# Patient Record
Sex: Male | Born: 1975 | Race: Black or African American | Hispanic: No | Marital: Single | Smoking: Current every day smoker
Health system: Southern US, Community
[De-identification: ages and names within clinical notes are randomized; demographics above are authoritative.]

---

## 2006-05-29 ENCOUNTER — Emergency Department: Payer: Self-pay | Admitting: Emergency Medicine

## 2007-07-18 ENCOUNTER — Emergency Department: Payer: Self-pay | Admitting: Emergency Medicine

## 2017-08-09 ENCOUNTER — Emergency Department
Admission: EM | Admit: 2017-08-09 | Discharge: 2017-08-09 | Disposition: A | Discharge status: Home / Self Care | Payer: Self-pay | Attending: Emergency Medicine | Admitting: Emergency Medicine

## 2017-08-09 DIAGNOSIS — J36 Peritonsillar abscess: Secondary | ICD-10-CM | POA: Insufficient documentation

## 2017-08-09 LAB — BASIC METABOLIC PANEL
ANION GAP: 6 (ref 5–15)
BUN: 13 mg/dL (ref 6–20)
CO2: 27 mmol/L (ref 22–32)
Calcium: 8.9 mg/dL (ref 8.9–10.3)
Chloride: 101 mmol/L (ref 101–111)
Creatinine, Ser: 1.1 mg/dL (ref 0.61–1.24)
GLUCOSE: 107 mg/dL — AB (ref 65–99)
POTASSIUM: 3.9 mmol/L (ref 3.5–5.1)
Sodium: 134 mmol/L — ABNORMAL LOW (ref 135–145)

## 2017-08-09 LAB — CBC WITH DIFFERENTIAL/PLATELET
BASOS ABS: 0.1 10*3/uL (ref 0–0.1)
Basophils Relative: 1 %
EOS PCT: 1 %
Eosinophils Absolute: 0.1 10*3/uL (ref 0–0.7)
HCT: 46.6 % (ref 40.0–52.0)
Hemoglobin: 15.4 g/dL (ref 13.0–18.0)
LYMPHS PCT: 7 %
Lymphs Abs: 1 10*3/uL (ref 1.0–3.6)
MCH: 27 pg (ref 26.0–34.0)
MCHC: 33.2 g/dL (ref 32.0–36.0)
MCV: 81.5 fL (ref 80.0–100.0)
Monocytes Absolute: 1.5 10*3/uL — ABNORMAL HIGH (ref 0.2–1.0)
Monocytes Relative: 11 %
NEUTROS ABS: 11.3 10*3/uL — AB (ref 1.4–6.5)
Neutrophils Relative %: 80 %
PLATELETS: 243 10*3/uL (ref 150–440)
RBC: 5.72 MIL/uL (ref 4.40–5.90)
RDW: 15.2 % — ABNORMAL HIGH (ref 11.5–14.5)
WBC: 14 10*3/uL — AB (ref 3.8–10.6)

## 2017-08-09 LAB — POCT RAPID STREP A: STREPTOCOCCUS, GROUP A SCREEN (DIRECT): NEGATIVE

## 2017-08-09 LAB — MONONUCLEOSIS SCREEN: MONO SCREEN: NEGATIVE

## 2017-08-09 MED ORDER — AMOXICILLIN-POT CLAVULANATE 875-125 MG PO TABS: 1.0000 | ORAL_TABLET | Freq: Two times a day (BID) | ORAL | 0 refills | Status: AC

## 2017-08-09 MED ORDER — DEXAMETHASONE SODIUM PHOSPHATE 10 MG/ML IJ SOLN
10.0000 mg | Freq: Once | INTRAMUSCULAR | Status: AC
  Administered 2017-08-09: 10 mg via INTRAVENOUS
  Filled 2017-08-09: qty 1

## 2017-08-09 MED ORDER — PREDNISONE 10 MG PO TABS: ORAL_TABLET | ORAL | 0 refills | Status: DC

## 2017-08-09 MED ORDER — SODIUM CHLORIDE 0.9 % IV SOLN
3.0000 g | Freq: Once | INTRAVENOUS | Status: AC
  Administered 2017-08-09: 3 g via INTRAVENOUS
  Filled 2017-08-09: qty 3

## 2017-08-09 NOTE — Discharge Instructions (Signed)
Today it appears that you have a peritonsillar cellulitis which could become a peritonsillar abscess. You were given the first dose of antibiotics in the emergency department. Begin taking Augmentin. Today the lowest Price is at [employer]. A coupon was printed for you. Also begin taking prednisone as directed. You may also take Tylenol with this medication as needed for fever or throat pain. Return to the emergency room if any severe worsening of your symptoms or inability to swallow pills. You may need to break the Augmentin 1/2 to swallow if too large.   Follow-up with Dr. Jenne CampusMcQueen if any continued problems on Monday and also for a recheck. His office location and phone number are listed on her discharge papers.

## 2017-08-09 NOTE — ED Triage Notes (Signed)
Pt c/o sore throat that began two days ago. Pt has dry cough and difficultly swallowing.

## 2017-08-09 NOTE — ED Provider Notes (Signed)
[facility] Emergency Department Provider Note   ____________________________________________   First MD Initiated Contact with Patient 08/09/17 1105     (approximate)  I have reviewed the triage vital signs and the nursing notes.   HISTORY  Chief Complaint Sore Throat   HPI Javier Wood is a 41 y.o. male is here complaining of sore throatthat began approximately 2 days ago. Patient states she also has had a dry cough. He has felt warm but not actually had a temperature that he is aware of. Patient states his throat is more painful when he swallows but has been swallowing fluids and also over-the-counter medication without difficulty. He states yesterday he slept a lot. He is not aware of any exposure to strep throat. He rates his pain as an 8/10.  History reviewed. No pertinent past medical history.  There are no active problems to display for this patient.   History reviewed. No pertinent surgical history.  Prior to Admission medications   Medication Sig Start Date End Date Taking? Authorizing Provider  amoxicillin-clavulanate (AUGMENTIN) 875-125 MG tablet Take 1 tablet by mouth 2 (two) times daily. 08/09/17 08/16/17  Tommi Rumps, PA-C  predniSONE (DELTASONE) 10 MG tablet Take 6 tablets  today, on day 2 take 5 tablets, day 3 take 4 tablets, day 4 take 3 tablets, day 5 take  2 tablets and 1 tablet the last day 08/09/17   Tommi Rumps, PA-C    Allergies Patient has no known allergies.  No family history on file.  Social History Social History  Substance Use Topics  . Smoking status: Never Smoker  . Smokeless tobacco: Never Used  . Alcohol use 1.2 oz/week    2 Cans of beer per week    Review of Systems Constitutional: No fever/chills Eyes: No visual changes. ENT: Positive sore throat. Cardiovascular: Denies chest pain. Respiratory: Denies shortness of breath. Gastrointestinal: No nausea, no vomiting.  Musculoskeletal: Negative  for back pain. Skin: Negative for rash. Neurological: Negative for headaches ____________________________________________   PHYSICAL EXAM:  VITAL SIGNS: ED Triage Vitals [08/09/17 1059]  Enc Vitals Group     BP (!) 127/93     Pulse Rate (!) 119     Resp      Temp 98.3 F (36.8 C)     Temp Source Oral     SpO2 98 %     Weight      Height      Head Circumference      Peak Flow      Pain Score 8     Pain Loc      Pain Edu?      Excl. in GC?    Constitutional: Alert and oriented. Well appearing and in no acute distress. Eyes: Conjunctivae are normal. PERRL. EOMI. Head: Atraumatic. Nose: No congestion/rhinnorhea. Mouth/Throat: Mucous membranes are moist. Mild erythema with right tonsil larger than left but no exudate is present. Uvula is still midline. No edema. Patient is having no difficulty with talking or swallowing. Patient speaks with a muffled voice. Neck: No stridor.   Hematological/Lymphatic/Immunilogical: No cervical lymphadenopathy. Cardiovascular: Normal rate, regular rhythm. Grossly normal heart sounds.  Good peripheral circulation. Respiratory: Normal respiratory effort.  No retractions. Lungs CTAB. Musculoskeletal: Moves upper and lower extremities without difficulty. Normal gait was noted. Neurologic:  Normal speech and language. No gross focal neurologic deficits are appreciated.  Skin:  Skin is warm, dry and intact. No rash noted. Psychiatric: Mood and affect are normal. Speech  and behavior are normal.  ____________________________________________   LABS (all labs ordered are listed, but only abnormal results are displayed)  Labs Reviewed  CBC WITH DIFFERENTIAL/PLATELET - Abnormal; Notable for the following:       Result Value   WBC 14.0 (*)    RDW 15.2 (*)    Neutro Abs 11.3 (*)    Monocytes Absolute 1.5 (*)    All other components within normal limits  BASIC METABOLIC PANEL - Abnormal; Notable for the following:    Sodium 134 (*)    Glucose, Bld  107 (*)    All other components within normal limits  MONONUCLEOSIS SCREEN  POCT RAPID STREP A    PROCEDURES  Procedure(s) performed: None  Procedures  Critical Care performed: No  ____________________________________________   INITIAL IMPRESSION / ASSESSMENT AND PLAN / ED COURSE  Pertinent labs & imaging results that were available during my care of the patient were reviewed by me and considered in my medical decision making (see chart for details).  Patient was given Unasyn 3 g IV and Decadron 10 mg. He voiced that his throat was feeling better. He still continues to drink fluids with no difficulty swallowing or maintaining secretions. Patient was discharged on Augmentin 875 twice a day for 10 days and a tapering dose of prednisone starting at 60 mg and tapering over the next 6 days. He is follow-up with Dr. Jenne CampusMcQueen since he does not have a PCP. He is instructed to call Monday for an appointment. He will return to the emergency room if any severe worsening of his symptoms.   __________________________________________   FINAL CLINICAL IMPRESSION(S) / ED DIAGNOSES  Final diagnoses:  Peritonsillar cellulitis      NEW MEDICATIONS STARTED DURING THIS VISIT:  Discharge Medication List as of 08/09/2017  1:26 PM    START taking these medications   Details  amoxicillin-clavulanate (AUGMENTIN) 875-125 MG tablet Take 1 tablet by mouth 2 (two) times daily., Starting Sat 08/09/2017, Until Sat 08/16/2017, Print    predniSONE (DELTASONE) 10 MG tablet Take 6 tablets  today, on day 2 take 5 tablets, day 3 take 4 tablets, day 4 take 3 tablets, day 5 take  2 tablets and 1 tablet the last day, Print         Note:  This document was prepared using Dragon voice recognition software and may include unintentional dictation errors.    Tommi RumpsSummers, Shavell Nored L, PA-C 08/09/17 1511    Loleta RoseForbach, Cory, MD 08/09/17 [phone]

## 2018-10-02 LAB — HM HIV SCREENING LAB: HM HIV Screening: NEGATIVE

## 2019-01-31 ENCOUNTER — Other Ambulatory Visit
Admission: AD | Admit: 2019-01-31 | Discharge: 2019-01-31 | Disposition: A | Discharge status: Home / Self Care | Attending: Family Medicine | Admitting: Family Medicine

## 2019-01-31 ENCOUNTER — Other Ambulatory Visit: Payer: Self-pay

## 2019-01-31 NOTE — ED Notes (Signed)
Patient here with [facility] for forensic blood draw under a search warrant.

## 2019-01-31 NOTE — ED Notes (Signed)
Blood specimens obtained from left antecub after area cleansed with betadine.  Patient tolerated well.  Specimens x 2 given to [employer].

## 2020-03-07 ENCOUNTER — Other Ambulatory Visit: Payer: Self-pay

## 2020-03-07 ENCOUNTER — Ambulatory Visit: Payer: Self-pay | Admitting: Physician Assistant

## 2020-03-07 DIAGNOSIS — Z113 Encounter for screening for infections with a predominantly sexual mode of transmission: Secondary | ICD-10-CM

## 2020-03-07 DIAGNOSIS — Z202 Contact with and (suspected) exposure to infections with a predominantly sexual mode of transmission: Secondary | ICD-10-CM

## 2020-03-07 MED ORDER — CEFTRIAXONE SODIUM 250 MG IJ SOLR
500.0000 mg | Freq: Once | INTRAMUSCULAR | Status: AC
  Administered 2020-03-07: 17:00:00 500 mg via INTRAMUSCULAR

## 2020-03-07 MED ORDER — DOXYCYCLINE HYCLATE 100 MG PO TABS: 100.0000 mg | ORAL_TABLET | Freq: Two times a day (BID) | ORAL | 0 refills | Status: AC

## 2020-03-08 ENCOUNTER — Encounter: Payer: Self-pay | Admitting: Physician Assistant

## 2020-03-08 NOTE — Progress Notes (Signed)
   [facility] Department STI clinic/screening visit  Subjective:  Javier Wood is a 44 y.o. male being seen today for an STI screening visit. The patient reports they do not have symptoms.    Patient has the following medical conditions:  There are no problems to display for this patient.    Chief Complaint  Patient presents with  . SEXUALLY TRANSMITTED DISEASE    HPI  Patient reports that he does not have any symptoms but that he is a contact to [facility].  States that he was notified by a recent partner that she tested positive for GC.  Declines screening exam, testing and blood work today and requests treatment only.   See flowsheet for further details and programmatic requirements.    The following portions of the patient's history were reviewed and updated as appropriate: allergies, current medications, past medical history, past social history, past surgical history and problem list.  Objective:  There were no vitals filed for this visit.  Physical Exam Constitutional:      General: He is not in acute distress.    Appearance: Normal appearance. He is normal weight.  HENT:     Head: Normocephalic and atraumatic.  Eyes:     Conjunctiva/sclera: Conjunctivae normal.  Pulmonary:     Effort: Pulmonary effort is normal.  Neurological:     Mental Status: He is alert and oriented to person, place, and time.  Psychiatric:        Mood and Affect: Mood normal.        Behavior: Behavior normal.        Thought Content: Thought content normal.        Judgment: Judgment normal.       Assessment and Plan:  Javier Wood is a 44 y.o. male presenting to the [facility] Department for STI screening  1. Screening for STD (sexually transmitted disease) Patient into clinic without symptoms. Rec condoms with all sex. RTC prn.  2. Gonorrhea contact Will treat as a contact to [facility] and for possible Chlamydia with Ceftriaxone 500mg  IM and Doxycycline 100mg  #14 1  po BID for 7 days. No sex for 14 days and until after partner completes treatment. Call with question or concerns . - cefTRIAXone (ROCEPHIN) injection 500 mg - doxycycline (VIBRA-TABS) 100 MG tablet; Take 1 tablet (100 mg total) by mouth 2 (two) times daily for 7 days.  Dispense: 14 tablet; Refill: 0     No follow-ups on file.  No future appointments.  , PA

## 2020-03-12 NOTE — Progress Notes (Signed)
Chart reviewed by Pharmacist  Suzanne Walker PharmD, Contract Pharmacist at [facility]  

## 2020-05-25 ENCOUNTER — Other Ambulatory Visit: Payer: Self-pay

## 2020-05-25 ENCOUNTER — Encounter: Payer: Self-pay | Admitting: Physician Assistant

## 2020-05-25 ENCOUNTER — Ambulatory Visit: Payer: Self-pay | Admitting: Physician Assistant

## 2020-05-25 DIAGNOSIS — Z113 Encounter for screening for infections with a predominantly sexual mode of transmission: Secondary | ICD-10-CM

## 2020-05-25 DIAGNOSIS — Z202 Contact with and (suspected) exposure to infections with a predominantly sexual mode of transmission: Secondary | ICD-10-CM

## 2020-05-25 MED ORDER — DOXYCYCLINE HYCLATE 100 MG PO TABS: 100.0000 mg | ORAL_TABLET | Freq: Two times a day (BID) | ORAL | 0 refills | Status: AC

## 2020-05-25 MED ORDER — CEFTRIAXONE SODIUM 500 MG IJ SOLR
500.0000 mg | Freq: Once | INTRAMUSCULAR | Status: AC
  Administered 2020-05-25: 500 mg via INTRAMUSCULAR

## 2020-05-25 NOTE — Progress Notes (Signed)
Pt here for STD screening. 

## 2020-05-25 NOTE — Progress Notes (Signed)
Pt treated as a contact to Gonorrhea per Sadie Haber, PA order. Pt tolerated well. Counseled pt per provider orders and pt states understanding. Provider orders completed.

## 2020-05-26 ENCOUNTER — Encounter: Payer: Self-pay | Admitting: Physician Assistant

## 2020-05-26 NOTE — Progress Notes (Signed)
   [facility] Department STI clinic/screening visit  Subjective:  Javier Wood is a 44 y.o. male being seen today for an STI screening visit. The patient reports they do not have symptoms.    Patient has the following medical conditions:  There are no problems to display for this patient.    Chief Complaint  Patient presents with  . SEXUALLY TRANSMITTED DISEASE    screening    HPI  Patient reports that he is not having symptoms but is a contact to GC.  Denies chronic conditions, surgeries and regular medicines.  Requests treatment only today.    See flowsheet for further details and programmatic requirements.    The following portions of the patient's history were reviewed and updated as appropriate: allergies, current medications, past medical history, past social history, past surgical history and problem list.  Objective:  There were no vitals filed for this visit.  Physical Exam Constitutional:      General: He is not in acute distress.    Appearance: Normal appearance.  HENT:     Head: Normocephalic and atraumatic.  Eyes:     Conjunctiva/sclera: Conjunctivae normal.  Pulmonary:     Effort: Pulmonary effort is normal.  Neurological:     Mental Status: He is alert and oriented to person, place, and time.  Psychiatric:        Mood and Affect: Mood normal.        Behavior: Behavior normal.        Thought Content: Thought content normal.        Judgment: Judgment normal.       Assessment and Plan:  [name] is a 44 y.o. male presenting to the [facility] Department for STI screening  1. Screening for STD (sexually transmitted disease) Patient into clinic without symptoms.   Declines screening exam and blood work today. Rec condoms with all sex.   2. Gonorrhea contact Treat as a contact to [facility] and to cover for Chlamydia with Ceftriaxone 500mg  IM today and Doxycycline 100mg  #14 1 po BID for 7days. No sex until 7 days after  completing medicine and until after partner completes treatment. Call with questions or concerns. - cefTRIAXone (ROCEPHIN) injection 500 mg - doxycycline (VIBRA-TABS) 100 MG tablet; Take 1 tablet (100 mg total) by mouth 2 (two) times daily for 7 days.  Dispense: 14 tablet; Refill: 0     Return for PRN.  Future Appointments  Date Time Provider Department Center  05/30/2020  3:30 PM , MD [facility] None    Corky Downs, [facility]

## 2020-05-30 ENCOUNTER — Other Ambulatory Visit: Payer: Self-pay

## 2020-05-30 ENCOUNTER — Ambulatory Visit: Admitting: Internal Medicine

## 2020-06-28 ENCOUNTER — Other Ambulatory Visit: Payer: Self-pay

## 2020-06-28 ENCOUNTER — Ambulatory Visit: Payer: Self-pay | Admitting: Family Medicine

## 2020-06-28 DIAGNOSIS — Z202 Contact with and (suspected) exposure to infections with a predominantly sexual mode of transmission: Secondary | ICD-10-CM

## 2020-07-04 ENCOUNTER — Encounter: Payer: Self-pay | Admitting: Family Medicine

## 2020-07-04 MED ORDER — CEFTRIAXONE SODIUM 500 MG IJ SOLR
500.0000 mg | Freq: Once | INTRAMUSCULAR | Status: AC
  Administered 2020-06-28: 500 mg via INTRAMUSCULAR

## 2020-07-04 NOTE — Progress Notes (Signed)
   [facility] Department STI clinic/screening visit  Subjective:  Javier Wood is a 44 y.o. male being seen today for an STI screening visit. The patient reports they do not have symptoms.    Patient has the following medical conditions:  There are no problems to display for this patient.    Chief Complaint  Patient presents with  . SEXUALLY TRANSMITTED DISEASE    screening/ contact to gonorrhea     HPI  Patient reports a contact to Gonorrhea, having intercourse with a untreated partner.  Patient declined exam and testing .  Patient on wants to be treated.  Patient was seen in 05/26/2020.     See flowsheet for further details and programmatic requirements.    The following portions of the patient's history were reviewed and updated as appropriate: allergies, current medications, past medical history, past social history, past surgical history and problem list.  Objective:  There were no vitals filed for this visit.  Physical Exam Neurological:     Mental Status: He is alert.  Psychiatric:        Mood and Affect: Mood normal.        Behavior: Behavior normal.     Patient declined exam.    Assessment and Plan:  Javier Wood is a 44 y.o. male presenting to the [facility] Department for STI screening    1. Gonorrhea contact - cefTRIAXone (ROCEPHIN) injection 500 mg  Patient declined exam reports as contact to untreated partner.  Patient was treated per standing order as contact with 500 mg of Rocephin IM x 1 in R Deltoid.  Patient instructed no sex for next 7 days and no sex with untreated partner. Condoms declined.   Patient verbalizes understanding.     Return if symptoms worsen or fail to improve.  No future appointments.  Wendi Snipes [facility]

## 2020-07-04 NOTE — Progress Notes (Signed)
Attestation of Attending Supervision of Enhanced Role Nurse:  At public health departments, Enhanced Role Nurses work under standing orders and procedures to provide STI related screening services.. I agree with the care provided to this patient and was available for any consultation as needed.  I have reviewed the RN's note and chart.  I was not consulted by the RN for the patient. If consulted documentation of consult is reflected in the RN's documentation.   Arian Mcquitty Niles Abdou Stocks, MD, MPH, ABFM Medical Director  [facility].   

## 2020-08-13 ENCOUNTER — Other Ambulatory Visit: Payer: Self-pay

## 2020-08-13 ENCOUNTER — Emergency Department
Admission: EM | Admit: 2020-08-13 | Discharge: 2020-08-13 | Disposition: A | Discharge status: Home / Self Care | Attending: Emergency Medicine | Admitting: Emergency Medicine

## 2020-08-13 ENCOUNTER — Emergency Department

## 2020-08-13 DIAGNOSIS — S060X1A Concussion with loss of consciousness of 30 minutes or less, initial encounter: Secondary | ICD-10-CM | POA: Insufficient documentation

## 2020-08-13 DIAGNOSIS — W228XXA Striking against or struck by other objects, initial encounter: Secondary | ICD-10-CM | POA: Insufficient documentation

## 2020-08-13 DIAGNOSIS — Y999 Unspecified external cause status: Secondary | ICD-10-CM | POA: Insufficient documentation

## 2020-08-13 DIAGNOSIS — Y9289 Other specified places as the place of occurrence of the external cause: Secondary | ICD-10-CM | POA: Insufficient documentation

## 2020-08-13 DIAGNOSIS — S7001XA Contusion of right hip, initial encounter: Secondary | ICD-10-CM | POA: Insufficient documentation

## 2020-08-13 DIAGNOSIS — F17210 Nicotine dependence, cigarettes, uncomplicated: Secondary | ICD-10-CM | POA: Insufficient documentation

## 2020-08-13 DIAGNOSIS — Y9389 Activity, other specified: Secondary | ICD-10-CM | POA: Insufficient documentation

## 2020-08-13 DIAGNOSIS — F17290 Nicotine dependence, other tobacco product, uncomplicated: Secondary | ICD-10-CM | POA: Insufficient documentation

## 2020-08-13 MED ORDER — MELOXICAM 15 MG PO TABS: 15.0000 mg | ORAL_TABLET | Freq: Every day | ORAL | 0 refills | Status: AC

## 2020-08-13 MED ORDER — METHOCARBAMOL 500 MG PO TABS: 500.0000 mg | ORAL_TABLET | Freq: Four times a day (QID) | ORAL | 0 refills | Status: AC

## 2020-08-13 MED ORDER — OXYCODONE-ACETAMINOPHEN 5-325 MG PO TABS
1.0000 | ORAL_TABLET | Freq: Once | ORAL | Status: AC
Start: 2020-08-13 — End: 2020-08-13
  Administered 2020-08-13: 1 via ORAL
  Filled 2020-08-13: qty 1

## 2020-08-13 MED ORDER — OXYCODONE-ACETAMINOPHEN 5-325 MG PO TABS: 1.0000 | ORAL_TABLET | ORAL | 0 refills | Status: AC | PRN

## 2020-08-13 NOTE — ED Notes (Signed)
Patient to go to [facility] per MD Cyril Loosen

## 2020-08-13 NOTE — ED Provider Notes (Signed)
[facility] Emergency Department Provider Note  ____________________________________________   First MD Initiated Contact with Patient 08/13/20 2032     (approximate)  I have reviewed the triage vital signs and the nursing notes.   HISTORY  Chief Complaint Head Injury  HPI Javier Wood is a 44 y.o. male who presents to the emergency department for evaluation following a Worker's Comp. injury that occurred on Friday.  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  It hit a large box before knocking into him on the left side of his body, causing the right side of his body to hit a rock wall.  At the time of injury, he states he lost consciousness and coworkers described this for being about 30 to 60 seconds.  He was seen at an urgent care outside of our system where his right ear laceration was repaired and he was placed on Bactrim and ibuprofen.  However he presents today due to worsening headache, pain with opening and closing his jaw on the right side, right hip pain.  He states that these have worsened over the last couple days as he has become more sore from the incident.  He denies any further syncope, dizziness, changes in vision.  The headache is isolated to the right side of his head.  He has tried the ibuprofen that he was prescribed without any improvement.         History reviewed. No pertinent past medical history.  There are no problems to display for this patient.   History reviewed. No pertinent surgical history.  Prior to Admission medications   Medication Sig Start Date End Date Taking? Authorizing Provider  meloxicam (MOBIC) 15 MG tablet Take 1 tablet (15 mg total) by mouth daily for 15 days. 08/13/20 08/28/20  Lucy Chris, PA  methocarbamol (ROBAXIN) 500 MG tablet Take 1 tablet (500 mg total) by mouth 4 (four) times  daily for 5 days. 08/13/20 08/18/20  Lucy Chris, PA  oxyCODONE-acetaminophen (PERCOCET) 5-325 MG tablet Take 1 tablet by mouth every 4 (four) hours as needed for severe pain. 08/13/20 08/13/21  Lucy Chris, PA    Allergies Patient has no known allergies.  No family history on file.  Social History Social History   Tobacco Use   Smoking status: Current Every Day Smoker    Types: E-cigarettes, Cigarettes   Smokeless tobacco: Never Used  Substance Use Topics   Alcohol use: Yes    Comment: 3 times a week    Drug use: Not Currently    Types: Marijuana    Review of Systems Constitutional: No fever/chills Eyes: No visual changes. ENT: + Jaw pain Cardiovascular: Denies chest pain. Respiratory: Denies shortness of breath. Gastrointestinal: No abdominal pain.  No nausea, no vomiting.  No diarrhea.  No constipation. Genitourinary: Negative for dysuria. Musculoskeletal: + Right hip pain Skin: Negative for rash. Neurological: + Headache, denies focal weakness or numbness. ____________________________________________   PHYSICAL EXAM:  VITAL SIGNS: ED Triage Vitals  Enc Vitals Group     BP 08/13/20 1855 126/84     Pulse Rate 08/13/20 1855 86     Resp 08/13/20 1855 16     Temp 08/13/20 1855 98.3 F (36.8 C)     Temp Source 08/13/20 1855 Oral     SpO2 08/13/20 1855 98 %     Weight 08/13/20  1915 215 lb (97.5 kg)     Height 08/13/20 1915 6\' 2"  (1.88 m)     Head Circumference --      Peak Flow --      Pain Score 08/13/20 1916 10     Pain Loc --      Pain Edu? --      Excl. in GC? --    Constitutional: Alert and oriented. Well appearing and in no acute distress. Eyes: Conjunctivae are normal. PERRL. EOMI. Head: There is mild soft tissue swelling noted to the right aspect of the scalp just superior to the right ear.  There is a set of sutures in place at the auricle of the right ear that appears in appropriate alignment without drainage. Nose: No  congestion/rhinnorhea. Mouth/Throat: Mucous membranes are moist.  Oropharynx non-erythematous.  There is tenderness to the right mandible and maxilla. Neck: No stridor.  No cervical spine tenderness to palpation Cardiovascular: Normal rate, regular rhythm. Grossly normal heart sounds.  Good peripheral circulation. Respiratory: Normal respiratory effort.  No retractions. Lungs CTAB. Gastrointestinal: Soft and nontender. No distention. No abdominal bruits. No CVA tenderness. Musculoskeletal: There is a abrasion to the right lower leg.  There is a small abrasion with a larger underlying hematoma to the lateral aspect of the right hip. Neurologic:  Normal speech and language. No gross focal neurologic deficits are appreciated. No gait instability. Skin:  Skin is warm, dry and intact. No rash noted. Psychiatric: Mood and affect are normal. Speech and behavior are normal.   ____________________________________________  RADIOLOGY  Official radiology report(s): CT Head Wo Contrast  Result Date: 08/13/2020 CLINICAL DATA:  Struck in head with bucket EXAM: CT HEAD WITHOUT CONTRAST TECHNIQUE: Contiguous axial images were obtained from the base of the skull through the vertex without intravenous contrast. COMPARISON:  None. FINDINGS: Brain: No acute territorial infarction, hemorrhage or intracranial mass. The ventricles are nonenlarged. Vascular: No hyperdense vessel or unexpected calcification. Skull: Normal. Negative for fracture or focal lesion. Sinuses/Orbits: No acute finding. Other: None IMPRESSION: Negative non contrasted CT appearance of the brain. Electronically Signed   By: Jasmine PangKim  Fujinaga M.D.   On: 08/13/2020 19:41   DG Hip Unilat W or Wo Pelvis 2-3 Views Right  Result Date: 08/13/2020 CLINICAL DATA:  Initial evaluation for acute right hip pain status post recent trauma. EXAM: DG HIP (WITH OR WITHOUT PELVIS) 2-3V RIGHT COMPARISON:  None. FINDINGS: No acute fracture dislocation. Femoral head in  normal alignment within the acetabula. 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 No focal osseous lesions. No visible soft tissue injury. Few retained metallic BBs overlie the soft tissues of the proximal right thigh. IMPRESSION: 1. No acute osseous abnormality about the right hip. 2. Mild focal prominence at the anterosuperior aspect of the femoral head/neck junction, suggesting underlying cam type femoroacetabular impingement. Electronically Signed   By: Rise MuBenjamin  McClintock M.D.   On: 08/13/2020 19:55   CT Maxillofacial Wo Contrast  Result Date: 08/13/2020 CLINICAL DATA:  Status post trauma. EXAM: CT MAXILLOFACIAL WITHOUT CONTRAST TECHNIQUE: Multidetector CT imaging of the maxillofacial structures was performed. Multiplanar CT image reconstructions were also generated. COMPARISON:  None. FINDINGS: Osseous: No fracture or mandibular dislocation. No destructive process. Orbits: Negative. No traumatic or inflammatory finding. Sinuses: Clear. Soft tissues: Mild-to-moderate severity focal soft tissue swelling is seen just above the right ear. Two subcentimeter foci of soft tissue air are also noted within this  region. Limited intracranial: No significant or unexpected finding. IMPRESSION: 1. Mild-to-moderate severity focal soft tissue swelling just above the right ear. 2. No evidence of acute fracture or dislocation. Electronically Signed   By: Aram Candela M.D.   On: 08/13/2020 21:07    ____________________________________________   INITIAL IMPRESSION / ASSESSMENT AND PLAN / ED COURSE  As part of my medical decision making, I reviewed the following data within the electronic MEDICAL RECORD NUMBER Nursing notes reviewed and incorporated, Notes from prior ED visits and [place] Controlled Substance Database        Javier Wood is a 44 year old male who presents to the emergency department for  evaluation following a workers comp injury that occurred 2 days ago when he was hit with an excavator knocking him into a rock wall on the right side of his body.  He was concern for increasing headache as well as pain in his right jaw and right hip.  His CT of his head as well as maxillofacial CT are negative for any acute findings.  His right hip x-rays are also negative for any acute fracture.  His physical exam is reassuring with eyes PERRLA and EO MI.  Neurological exam is grossly normal.  The patient also has full range of motion of all of his joints despite the pain that he is having.  At this time, we will treat the patient for concussion as well as contusion to the right hip.  He was prescribed ibuprofen at the urgent care but will change his anti-inflammatory and also give muscle relaxer and pain medicine.  He was also given a work note to remain out of work for the week.  He should return to follow-up with his PCP and it was encouraged that he should not drive on the pain medicine and muscle relaxer.  The patient is in acknowledgment with this and will return should he experience any worsening symptoms.        ____________________________________________   FINAL CLINICAL IMPRESSION(S) / ED DIAGNOSES  Final diagnoses:  Concussion with loss of consciousness of 30 minutes or less, initial encounter  Contusion of right hip, initial encounter     ED Discharge Orders         Ordered    oxyCODONE-acetaminophen (PERCOCET) 5-325 MG tablet  Every 4 hours PRN        08/13/20 2135    meloxicam (MOBIC) 15 MG tablet  Daily        08/13/20 2135    methocarbamol (ROBAXIN) 500 MG tablet  4 times daily        08/13/20 2135          *Please note:  Javier Wood was evaluated in Emergency Department on 08/13/2020 for the symptoms described in the history of present illness. He was evaluated in the context of the global COVID-19 pandemic, which necessitated consideration that the patient might  be at risk for infection with the SARS-CoV-2 virus that causes COVID-19. Institutional protocols and algorithms that pertain to the evaluation of patients at risk for COVID-19 are in a state of rapid change based on information released by regulatory bodies including the CDC and federal and state organizations. These policies and algorithms were followed during the patient's care in the ED.  Some ED evaluations and interventions may be delayed as a result of limited staffing during and the pandemic.*   Note:  This document was prepared using Dragon voice recognition software and may include unintentional dictation errors.  Lucy Chris, PA 08/13/20 2252    Sharman Cheek, MD 08/13/20 [phone]

## 2020-08-13 NOTE — ED Triage Notes (Signed)
Patient reports being struck in the head by the bucket of an excavator on 9/10. Patient seen at urgent care at the time - sutures in place on right ear. Patient c/o continued headache and right hip pain.   Patient reports LOC after accident occurred.

## 2020-10-11 ENCOUNTER — Ambulatory Visit: Payer: Self-pay | Admitting: *Deleted

## 2021-04-14 IMAGING — CT CT MAXILLOFACIAL W/O CM
3 series · 16 of 47 positions shown, 19 images · non-contrast
Comparison: None.

CLINICAL DATA: Status post trauma.

EXAM:
CT MAXILLOFACIAL WITHOUT CONTRAST
TECHNIQUE: Multidetector CT imaging of the maxillofacial structures was
performed. Multiplanar CT image reconstructions were also generated.

[Series 2: max soft · axial · 0.37mm/px · z∈[-180,-18]mm · 10 of 95 slices shown, 13 images]
[im 7/95  brain]
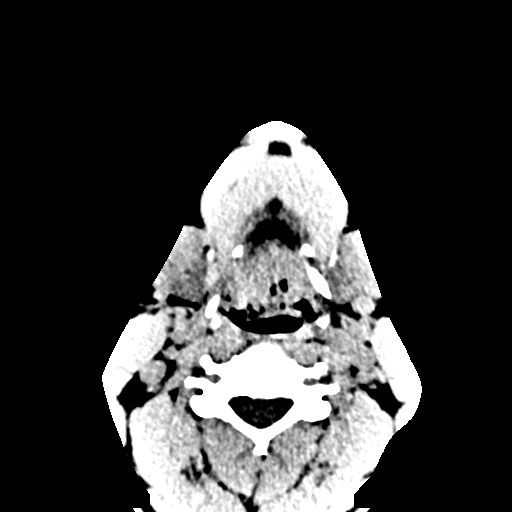
[im 7/95  bone]
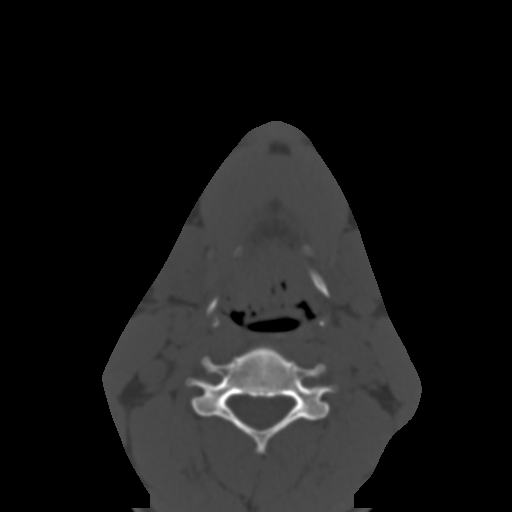
[im 17/95  bone]
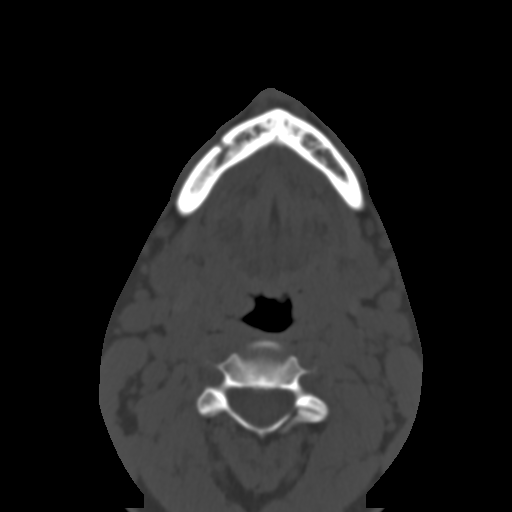
[im 26/95  bone]
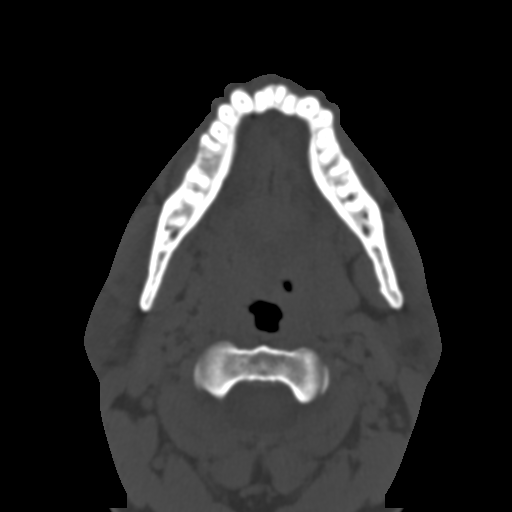
[im 33/95  bone]
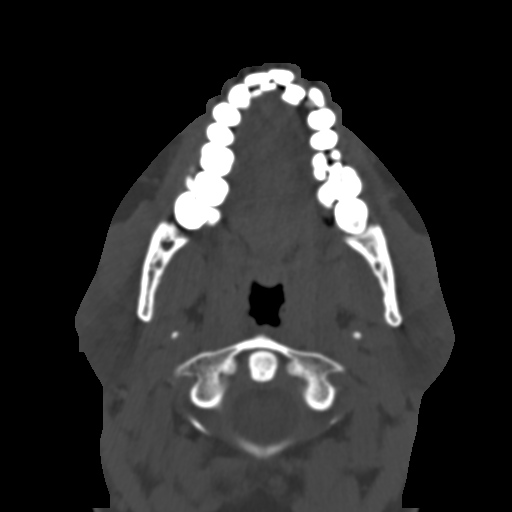
[im 43/95  brain]
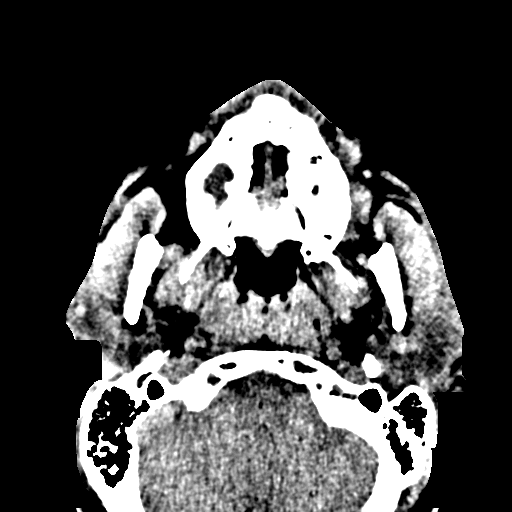
[im 43/95  bone]
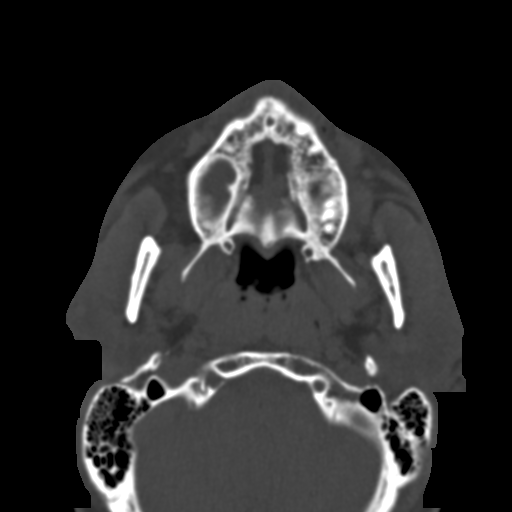
[im 52/95  bone]
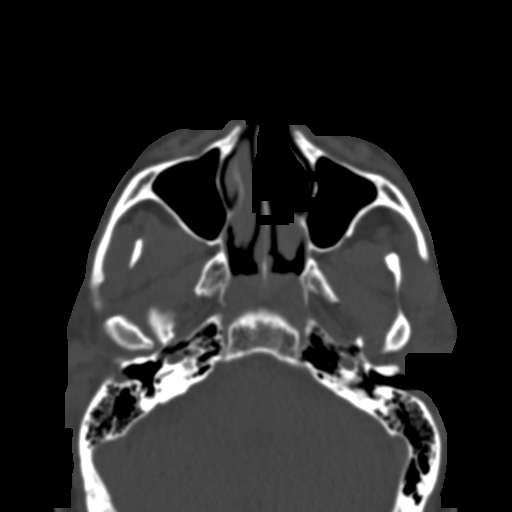
[im 62/95  bone]
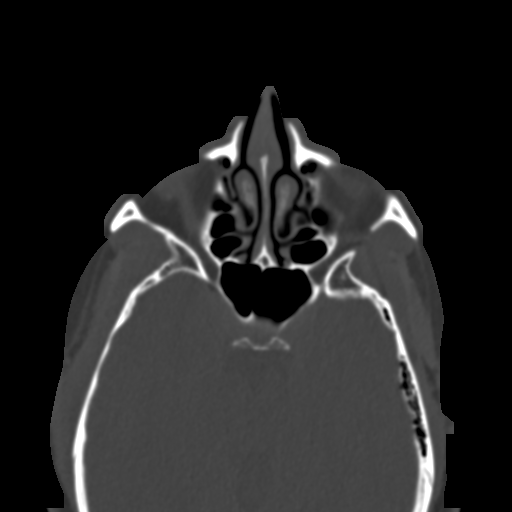
[im 72/95  bone]
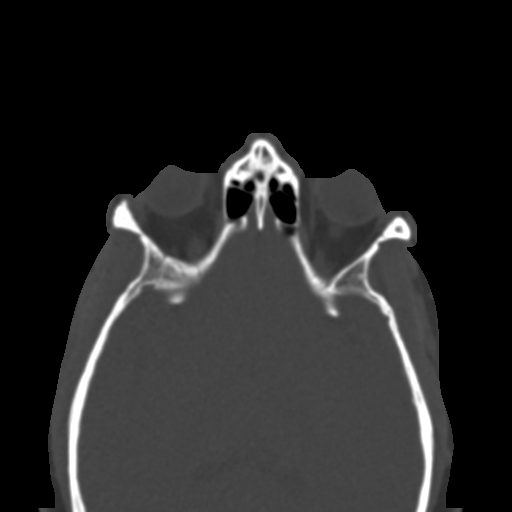
[im 78/95  brain]
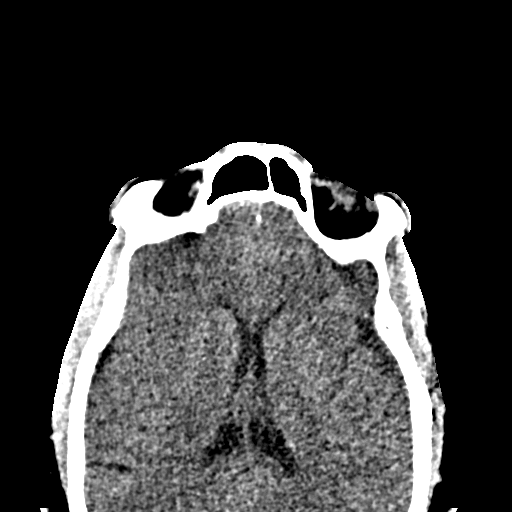
[im 78/95  bone]
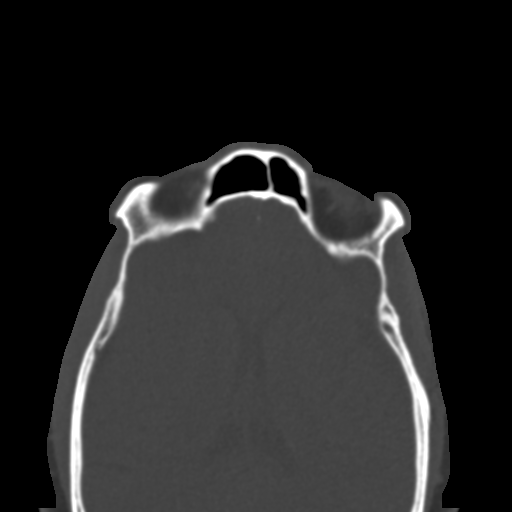
[im 88/95  bone]
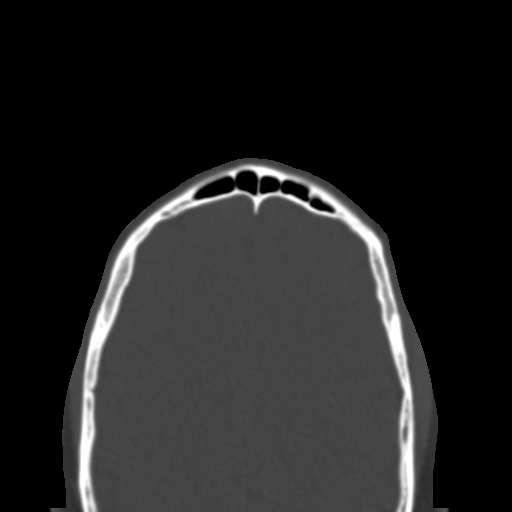

[Series 6: coronal soft · coronal · 0.35mm/px · 3 of 88 slices shown]
[im 30/88  bone]
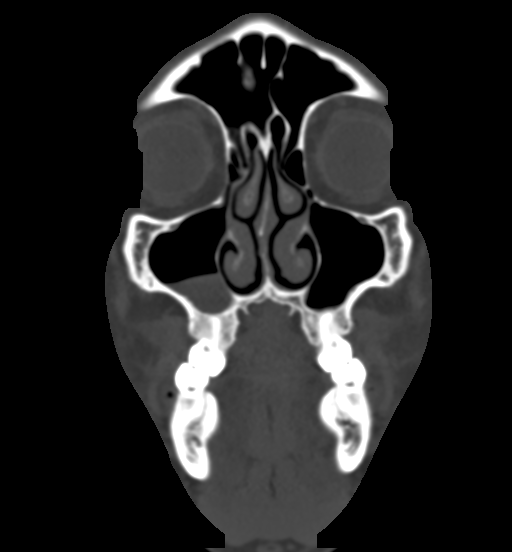
[im 39/88  bone]
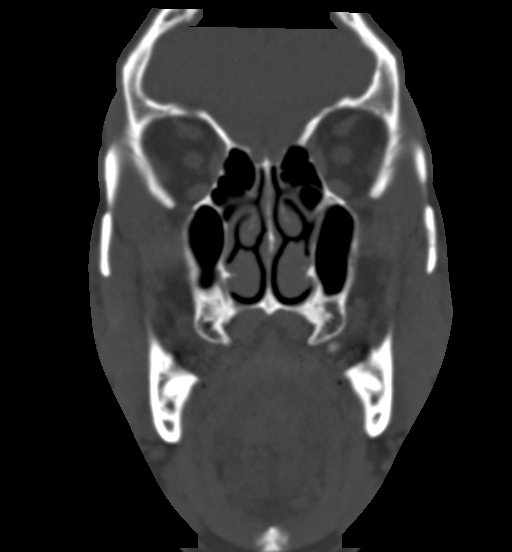
[im 49/88  bone]
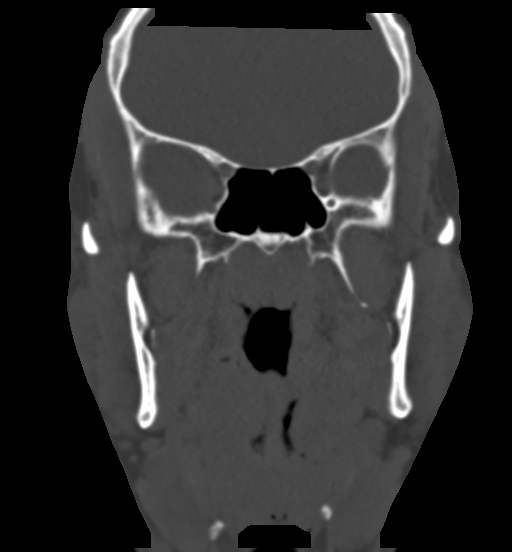

[Series 7: sagittal soft · sagittal · 0.38mm/px · 3 of 92 slices shown]
[im 31/92  bone]
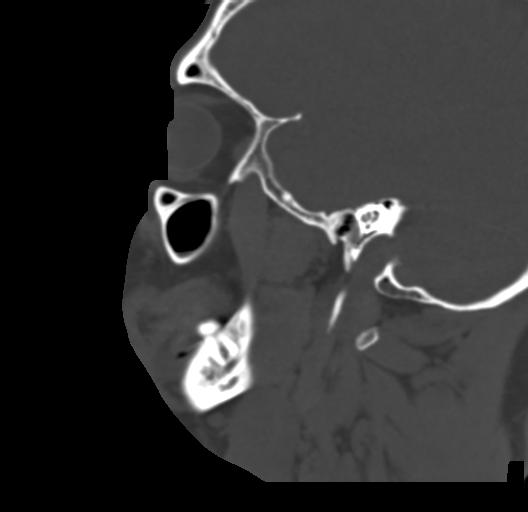
[im 46/92  bone]
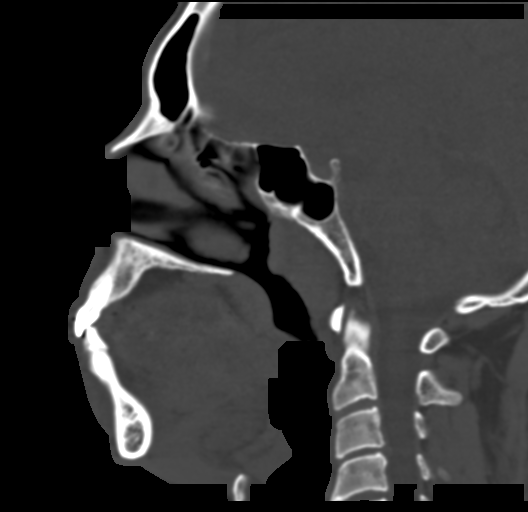
[im 61/92  bone]
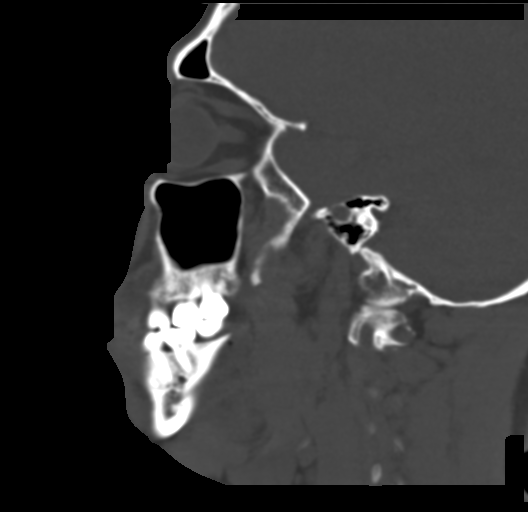

[16 of 47 positions shown; findings below may reference images not displayed]

FINDINGS: Osseous: No fracture or mandibular dislocation. No destructive
process.

Orbits: Negative. No traumatic or inflammatory finding.

Sinuses: Clear.

Soft tissues: Mild-to-moderate severity focal soft tissue swelling
is seen just above the right ear. Two subcentimeter foci of soft
tissue air are also noted within this region.

Limited intracranial: No significant or unexpected finding.
IMPRESSION: 1. Mild-to-moderate severity focal soft tissue swelling just above
the right ear.
2. No evidence of acute fracture or dislocation.

## 2022-08-28 ENCOUNTER — Ambulatory Visit: Payer: Self-pay | Admitting: *Deleted

## 2023-01-03 ENCOUNTER — Ambulatory Visit: Payer: Self-pay | Admitting: *Deleted
